# Patient Record
Sex: Female | Born: 1965 | Race: Black or African American | Hispanic: No | Marital: Single | Smoking: Never smoker
Health system: Southern US, Community
[De-identification: ages and names within clinical notes are randomized; demographics above are authoritative.]

## PROBLEM LIST (undated history)

## (undated) HISTORY — PX: CARPAL TUNNEL RELEASE: SHX101

## (undated) HISTORY — PX: OTHER SURGICAL HISTORY: SHX169

## (undated) HISTORY — PX: BREAST REDUCTION SURGERY: SHX8

---

## 2000-06-06 ENCOUNTER — Emergency Department (HOSPITAL_COMMUNITY): Admission: EM | Admit: 2000-06-06 | Discharge: 2000-06-06 | Payer: Self-pay | Admitting: Emergency Medicine

## 2000-09-03 ENCOUNTER — Emergency Department (HOSPITAL_COMMUNITY): Admission: EM | Admit: 2000-09-03 | Discharge: 2000-09-03 | Payer: Self-pay | Admitting: Emergency Medicine

## 2000-12-31 ENCOUNTER — Emergency Department (HOSPITAL_COMMUNITY): Admission: EM | Admit: 2000-12-31 | Discharge: 2000-12-31 | Payer: Self-pay | Admitting: Emergency Medicine

## 2001-02-13 ENCOUNTER — Emergency Department (HOSPITAL_COMMUNITY): Admission: EM | Admit: 2001-02-13 | Discharge: 2001-02-13 | Payer: Self-pay | Admitting: Emergency Medicine

## 2001-04-16 ENCOUNTER — Emergency Department (HOSPITAL_COMMUNITY): Admission: EM | Admit: 2001-04-16 | Discharge: 2001-04-16 | Payer: Self-pay | Admitting: Emergency Medicine

## 2001-04-16 ENCOUNTER — Encounter: Payer: Self-pay | Admitting: Emergency Medicine

## 2002-07-03 ENCOUNTER — Emergency Department (HOSPITAL_COMMUNITY): Admission: EM | Admit: 2002-07-03 | Discharge: 2002-07-03 | Payer: Self-pay | Admitting: *Deleted

## 2004-02-02 ENCOUNTER — Emergency Department (HOSPITAL_COMMUNITY): Admission: EM | Admit: 2004-02-02 | Discharge: 2004-02-02 | Payer: Self-pay | Admitting: Emergency Medicine

## 2004-06-01 ENCOUNTER — Emergency Department (HOSPITAL_COMMUNITY): Admission: EM | Admit: 2004-06-01 | Discharge: 2004-06-01 | Payer: Self-pay

## 2008-03-27 ENCOUNTER — Emergency Department (HOSPITAL_BASED_OUTPATIENT_CLINIC_OR_DEPARTMENT_OTHER): Admission: EM | Admit: 2008-03-27 | Discharge: 2008-03-27 | Payer: Self-pay | Admitting: Emergency Medicine

## 2009-05-18 ENCOUNTER — Ambulatory Visit (HOSPITAL_BASED_OUTPATIENT_CLINIC_OR_DEPARTMENT_OTHER): Admission: RE | Admit: 2009-05-18 | Discharge: 2009-05-18 | Payer: Self-pay | Admitting: Orthopaedic Surgery

## 2009-05-18 ENCOUNTER — Ambulatory Visit: Payer: Self-pay | Admitting: Diagnostic Radiology

## 2010-02-17 ENCOUNTER — Emergency Department (HOSPITAL_COMMUNITY): Admission: EM | Admit: 2010-02-17 | Discharge: 2010-02-18 | Payer: Self-pay | Admitting: Emergency Medicine

## 2011-09-09 ENCOUNTER — Emergency Department (HOSPITAL_BASED_OUTPATIENT_CLINIC_OR_DEPARTMENT_OTHER)
Admission: EM | Admit: 2011-09-09 | Discharge: 2011-09-09 | Disposition: A | Discharge status: Home / Self Care | Payer: Federal, State, Local not specified - PPO | Attending: Emergency Medicine | Admitting: Emergency Medicine

## 2011-09-09 ENCOUNTER — Encounter: Payer: Self-pay | Admitting: *Deleted

## 2011-09-09 DIAGNOSIS — A499 Bacterial infection, unspecified: Secondary | ICD-10-CM | POA: Insufficient documentation

## 2011-09-09 DIAGNOSIS — N760 Acute vaginitis: Secondary | ICD-10-CM | POA: Insufficient documentation

## 2011-09-09 DIAGNOSIS — R319 Hematuria, unspecified: Secondary | ICD-10-CM | POA: Insufficient documentation

## 2011-09-09 DIAGNOSIS — B9689 Other specified bacterial agents as the cause of diseases classified elsewhere: Secondary | ICD-10-CM | POA: Insufficient documentation

## 2011-09-09 LAB — URINALYSIS, ROUTINE W REFLEX MICROSCOPIC
Glucose, UA: NEGATIVE mg/dL
Leukocytes, UA: NEGATIVE
Nitrite: NEGATIVE
Protein, ur: NEGATIVE mg/dL
Urobilinogen, UA: 0.2 mg/dL (ref 0.0–1.0)

## 2011-09-09 LAB — URINE MICROSCOPIC-ADD ON

## 2011-09-09 LAB — PREGNANCY, URINE: Preg Test, Ur: NEGATIVE

## 2011-09-09 LAB — WET PREP, GENITAL: Trich, Wet Prep: NONE SEEN

## 2011-09-09 MED ORDER — METRONIDAZOLE 500 MG PO TABS: 500.0000 mg | ORAL_TABLET | Freq: Two times a day (BID) | ORAL | Status: AC

## 2011-09-09 NOTE — ED Provider Notes (Addendum)
History     CSN: [id number] Arrival date & time: 09/09/2011  4:52 AM   First MD Initiated Contact with Patient 09/09/11 [phone]      Chief Complaint  Patient presents with  . Vaginal Bleeding   a 45 year old female that is, gravida 3, para 3. Known history of tubal ligation. She states she has noticed a small amount of blood after urination when she wipes. She did not know whether the blood was in the urine./Urethra or vaginal. She does have regular menstrual cycles and is due for her next period. In 6 days. Patient denies any chance of being pregnant. She's had no rectal bleeding, no back pain, no nausea, vomiting, or weakness. She denies any known history of fibroids. She denies any recent sexual activity for at least 3 months. (Consider location/radiation/quality/duration/timing/severity/associated sxs/prior treatment) HPI  History reviewed. No pertinent past medical history.  History reviewed. No pertinent past surgical history.  No family history on file.  History  Substance Use Topics  . Smoking status: Not on file  . Smokeless tobacco: Not on file  . Alcohol Use: Not on file    OB History    Grav Para Term Preterm Abortions TAB SAB Ect Mult Living                  Review of Systems  All other systems reviewed and are negative.    Allergies  Percocet  Home Medications  No current outpatient prescriptions on file.  BP 129/78  Pulse 96  Temp(Src) 98.5 F (36.9 C) (Oral)  Resp 18  SpO2 100%  LMP 08/15/2011  Physical Exam  Constitutional: She is oriented to person, place, and time. She appears well-developed and well-nourished.  HENT:  Head: Normocephalic and atraumatic.  Eyes: Conjunctivae and EOM are normal. Pupils are equal, round, and reactive to light.  Neck: Neck supple.  Cardiovascular: Normal rate and regular rhythm.  Exam reveals no gallop and no friction rub.   No murmur heard. Pulmonary/Chest: Breath sounds normal. She has no wheezes. She has no  rales. She exhibits no tenderness.  Abdominal: Soft. Bowel sounds are normal. She exhibits no distension. There is no tenderness. There is no rebound and no guarding.  Genitourinary: Vagina normal.       Chaperone present during exam. External genitalia is normal. Cervix is closed, no bleeding, no discharge. Essentially normal  Musculoskeletal: Normal range of motion.  Neurological: She is alert and oriented to person, place, and time. No cranial nerve deficit. Coordination normal.  Skin: Skin is warm and dry. No rash noted.  Psychiatric: She has a normal mood and affect.    ED Course  Procedures (including critical care time)  Labs Reviewed  URINALYSIS, ROUTINE W REFLEX MICROSCOPIC - Abnormal; Notable for the following:    Appearance HAZY (*)    Specific Gravity, Urine 1.031 (*)    Hgb urine dipstick LARGE (*)    All other components within normal limits  WET PREP, GENITAL - Abnormal; Notable for the following:    Clue Cells, Wet Prep FEW (*)    WBC, Wet Prep HPF POC FEW (*)    All other components within normal limits  URINE MICROSCOPIC-ADD ON - Abnormal; Notable for the following:    Squamous Epithelial / LPF FEW (*)    Bacteria, UA FEW (*)    All other components within normal limits  PREGNANCY, URINE  GC/CHLAMYDIA PROBE AMP, GENITAL   No results found.   No diagnosis found.  MDM  Pt is seen and examined;  Initial history and physical completed.  Will follow.          Roshan Roback A. Patrica Duel, MD 09/09/11 0543  Results for orders placed during the hospital encounter of 09/09/11  PREGNANCY, URINE      Component Value Range   Preg Test, Ur NEGATIVE    URINALYSIS, ROUTINE W REFLEX MICROSCOPIC      Component Value Range   Color, Urine YELLOW  YELLOW    Appearance HAZY (*) CLEAR    Specific Gravity, Urine 1.031 (*) 1.005 - 1.030    pH 5.5  5.0 - 8.0    Glucose, UA NEGATIVE  NEGATIVE (mg/dL)   Hgb urine dipstick LARGE (*) NEGATIVE    Bilirubin Urine NEGATIVE   NEGATIVE    Ketones, ur NEGATIVE  NEGATIVE (mg/dL)   Protein, ur NEGATIVE  NEGATIVE (mg/dL)   Urobilinogen, UA 0.2  0.0 - 1.0 (mg/dL)   Nitrite NEGATIVE  NEGATIVE    Leukocytes, UA NEGATIVE  NEGATIVE   WET PREP, GENITAL      Component Value Range   Yeast, Wet Prep NONE SEEN  NONE SEEN    Trich, Wet Prep NONE SEEN  NONE SEEN    Clue Cells, Wet Prep FEW (*) NONE SEEN    WBC, Wet Prep HPF POC FEW (*) NONE SEEN   URINE MICROSCOPIC-ADD ON      Component Value Range   Squamous Epithelial / LPF FEW (*) RARE    WBC, UA 0-2  <3 (WBC/hpf)   RBC / HPF 7-10  <3 (RBC/hpf)   Bacteria, UA FEW (*) RARE    Urine-Other MUCOUS PRESENT     No results found.    Nichoel Digiulio A. Patrica Duel, MD 09/09/11 0543  Results for orders placed during the hospital encounter of 09/09/11  PREGNANCY, URINE      Component Value Range   Preg Test, Ur NEGATIVE    URINALYSIS, ROUTINE W REFLEX MICROSCOPIC      Component Value Range   Color, Urine YELLOW  YELLOW    Appearance HAZY (*) CLEAR    Specific Gravity, Urine 1.031 (*) 1.005 - 1.030    pH 5.5  5.0 - 8.0    Glucose, UA NEGATIVE  NEGATIVE (mg/dL)   Hgb urine dipstick LARGE (*) NEGATIVE    Bilirubin Urine NEGATIVE  NEGATIVE    Ketones, ur NEGATIVE  NEGATIVE (mg/dL)   Protein, ur NEGATIVE  NEGATIVE (mg/dL)   Urobilinogen, UA 0.2  0.0 - 1.0 (mg/dL)   Nitrite NEGATIVE  NEGATIVE    Leukocytes, UA NEGATIVE  NEGATIVE   WET PREP, GENITAL      Component Value Range   Yeast, Wet Prep NONE SEEN  NONE SEEN    Trich, Wet Prep NONE SEEN  NONE SEEN    Clue Cells, Wet Prep FEW (*) NONE SEEN    WBC, Wet Prep HPF POC FEW (*) NONE SEEN   URINE MICROSCOPIC-ADD ON      Component Value Range   Squamous Epithelial / LPF FEW (*) RARE    WBC, UA 0-2  <3 (WBC/hpf)   RBC / HPF 7-10  <3 (RBC/hpf)   Bacteria, UA FEW (*) RARE    Urine-Other MUCOUS PRESENT     No results found.    Erna Brossard A. Patrica Duel, MD 09/09/11 [id number]  Lorelle Gibbs. Patrica Duel, MD 09/09/11 [phone]

## 2011-09-09 NOTE — ED Notes (Signed)
Patient states that when she urinates there is a small amount of blood present. Unsure if this is vaginal or urethral bleeding. Still gets regular periods and this is not time, also does not use birth control. Denies any other symptoms

## 2011-09-10 LAB — GC/CHLAMYDIA PROBE AMP, GENITAL: GC Probe Amp, Genital: NEGATIVE

## 2011-09-18 IMAGING — CR DG TIBIA/FIBULA 2V*L*
4 series · 4 of 4 positions shown · non-contrast
Comparison: None

CLINICAL DATA: Injured leg.

LEFT TIBIA AND FIBULA - 2 VIEW

[t tib/fib ap right (1 of 2)]
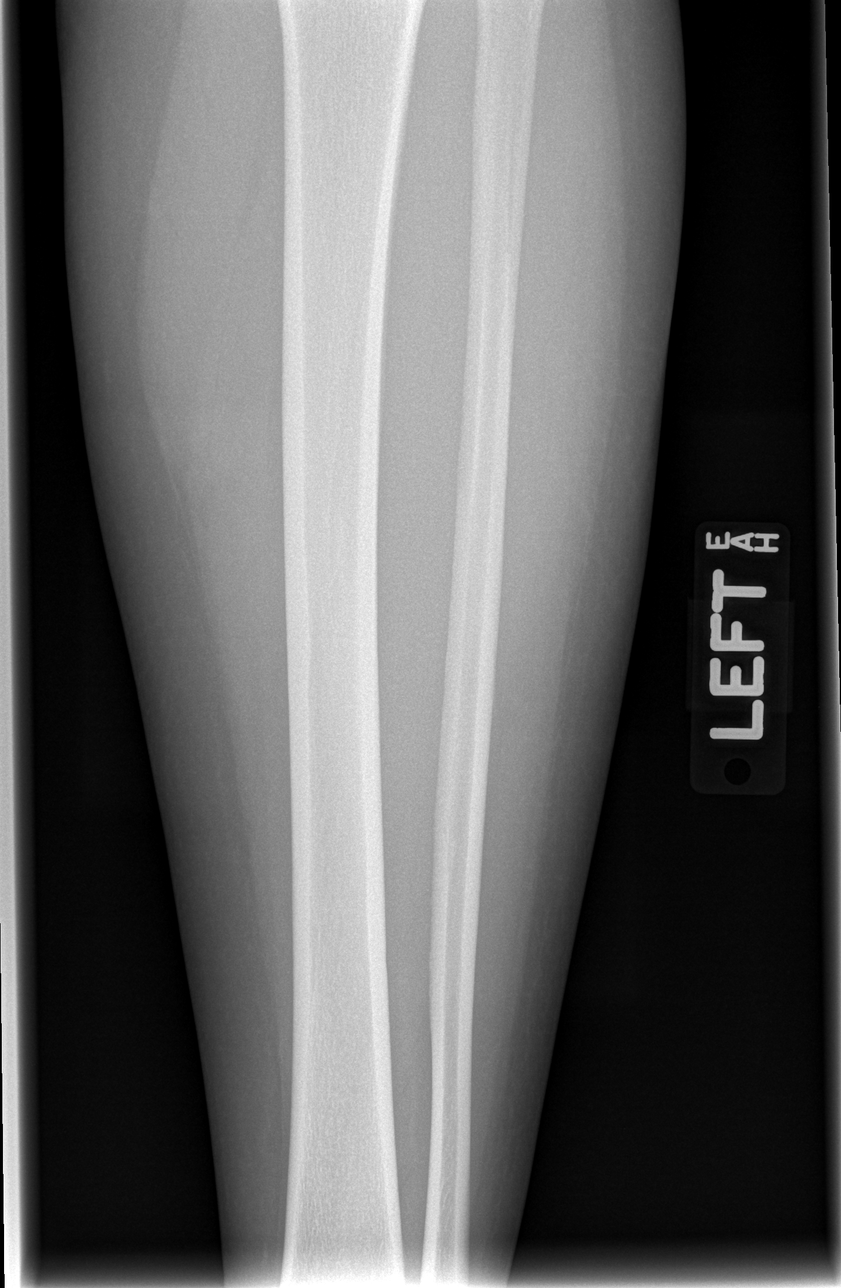

[t tib/fib ap right (2 of 2)]
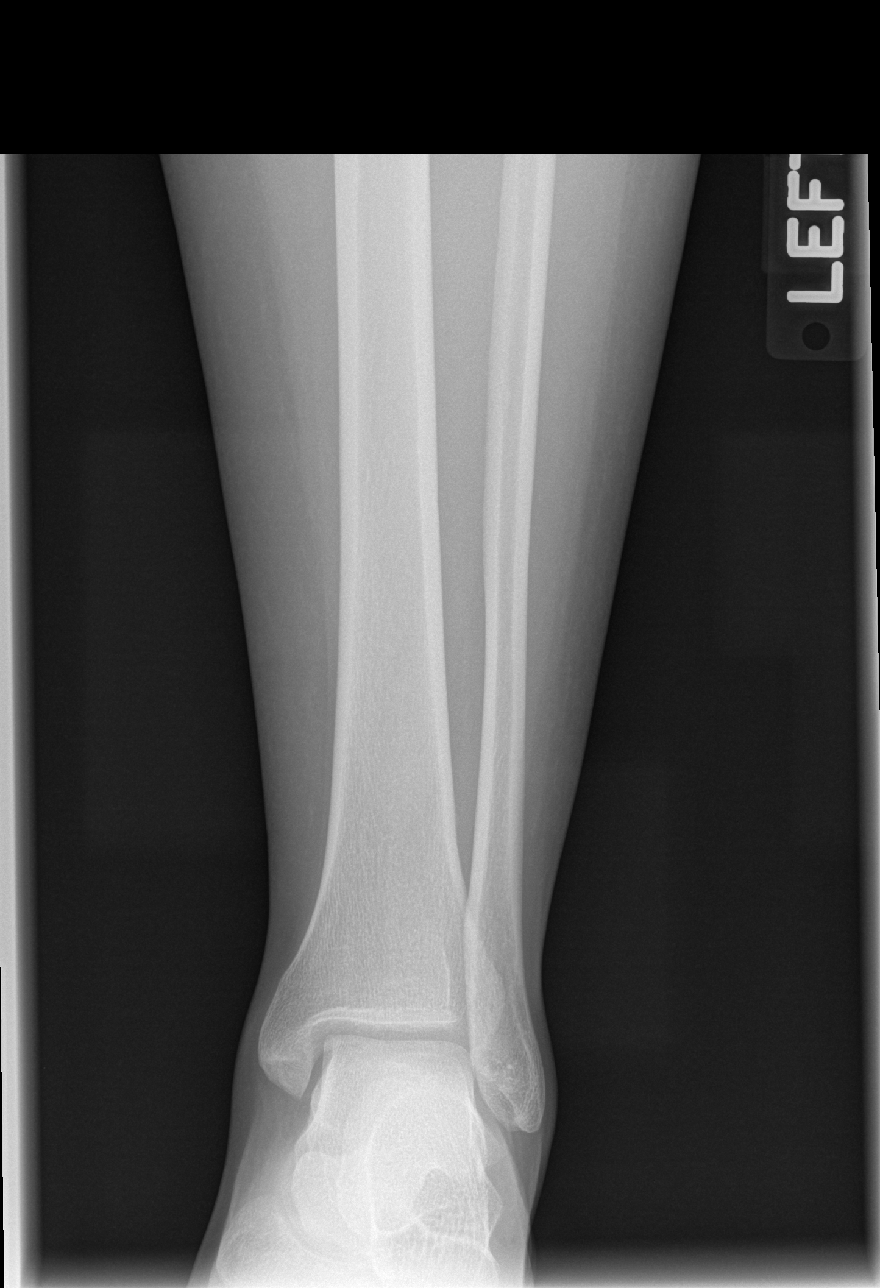

[t tib/fib lat right (1 of 2)]
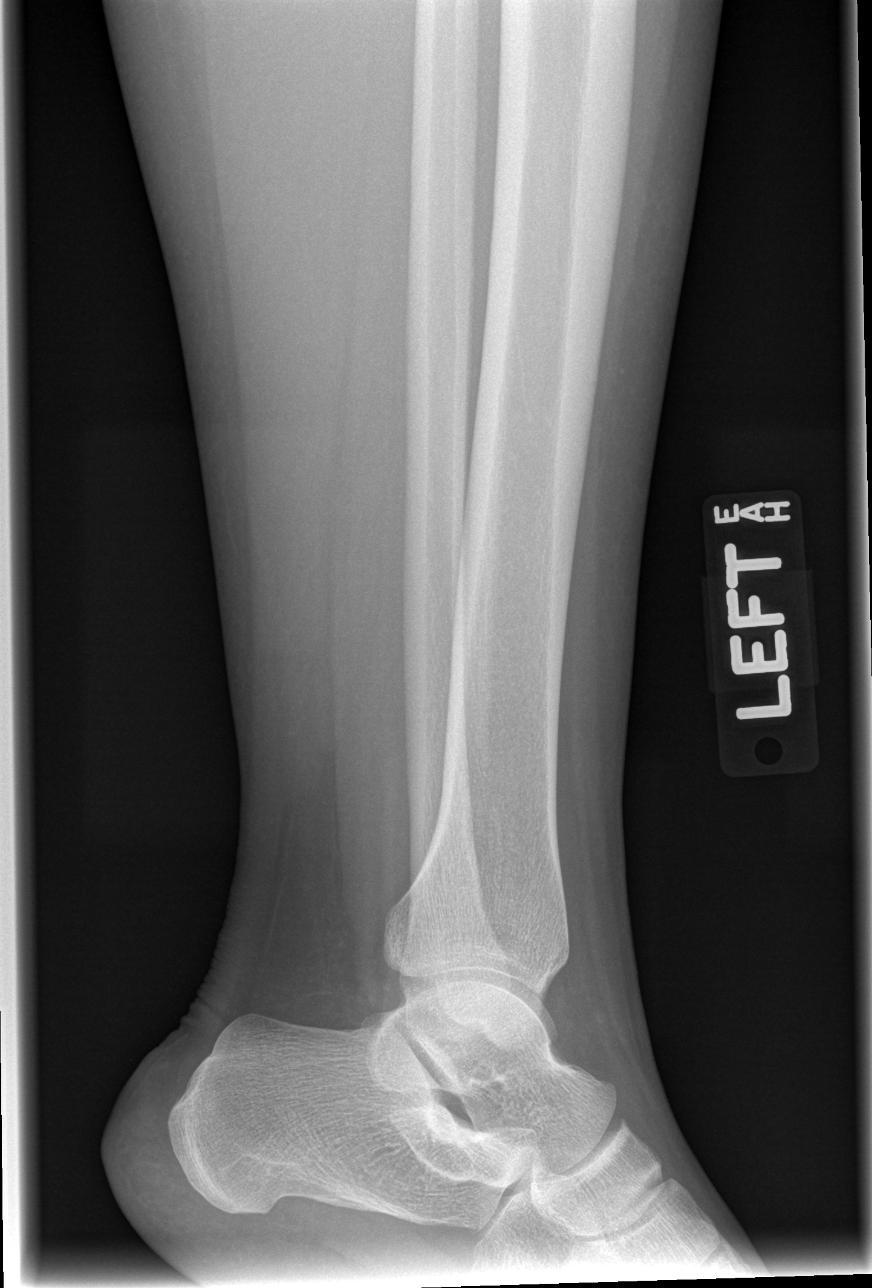

[t tib/fib lat right (2 of 2)]
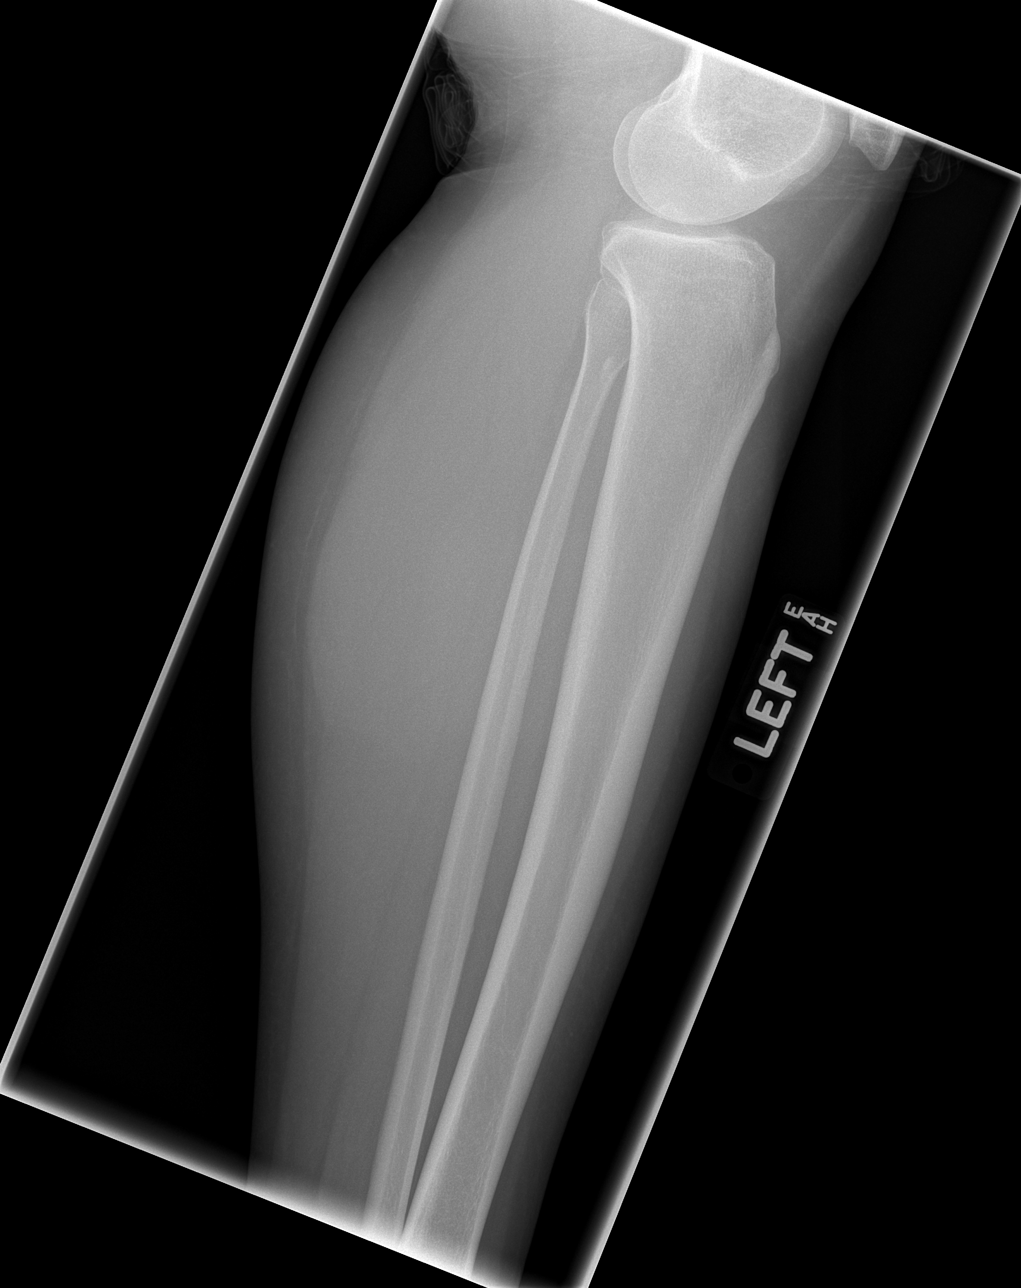

[4 of 4 positions shown; findings below may reference images not displayed]

FINDINGS: The knee and ankle joints are maintained.  No tibial or
fibular fractures are seen.
IMPRESSION: No acute bony findings.

## 2014-02-07 ENCOUNTER — Emergency Department (HOSPITAL_BASED_OUTPATIENT_CLINIC_OR_DEPARTMENT_OTHER)
Admission: EM | Admit: 2014-02-07 | Discharge: 2014-02-07 | Disposition: A | Discharge status: Home / Self Care | Payer: Federal, State, Local not specified - PPO | Attending: Emergency Medicine | Admitting: Emergency Medicine

## 2014-02-07 ENCOUNTER — Encounter (HOSPITAL_BASED_OUTPATIENT_CLINIC_OR_DEPARTMENT_OTHER): Payer: Self-pay | Admitting: Emergency Medicine

## 2014-02-07 DIAGNOSIS — L02219 Cutaneous abscess of trunk, unspecified: Secondary | ICD-10-CM | POA: Insufficient documentation

## 2014-02-07 DIAGNOSIS — L03319 Cellulitis of trunk, unspecified: Principal | ICD-10-CM

## 2014-02-07 DIAGNOSIS — L0390 Cellulitis, unspecified: Secondary | ICD-10-CM

## 2014-02-07 MED ORDER — CEPHALEXIN 500 MG PO CAPS
500.0000 mg | ORAL_CAPSULE | Freq: Four times a day (QID) | ORAL | Status: AC
Start: 2014-02-07 — End: ?

## 2014-02-07 MED ORDER — IBUPROFEN 800 MG PO TABS
800.0000 mg | ORAL_TABLET | Freq: Once | ORAL | Status: AC
  Administered 2014-02-07: 800 mg via ORAL
  Filled 2014-02-07: qty 1

## 2014-02-07 MED ORDER — IBUPROFEN 800 MG PO TABS: 800.0000 mg | ORAL_TABLET | Freq: Three times a day (TID) | ORAL | Status: AC

## 2014-02-07 MED ORDER — CEPHALEXIN 250 MG PO CAPS
500.0000 mg | ORAL_CAPSULE | Freq: Once | ORAL | Status: AC
  Administered 2014-02-07: 500 mg via ORAL
  Filled 2014-02-07: qty 2

## 2014-02-07 NOTE — ED Notes (Signed)
Pt c/o abscess to left side - states area present x3 days, area reddened, swollen, and painful.

## 2014-02-07 NOTE — ED Provider Notes (Signed)
CSN: [id number]     Arrival date & time 02/07/14  0347 History   First MD Initiated Contact with Patient 02/07/14 0541     Chief Complaint  Patient presents with  . Abscess     (Consider location/radiation/quality/duration/timing/severity/associated sxs/prior Treatment) Patient is a 48 y.o. female presenting with rash. The history is provided by the patient.  Rash Location: isolated spot left lateral back. Quality: painful and redness   Quality comment:  Under bra strap Pain details:    Quality:  Aching   Severity:  Moderate   Onset quality:  Sudden   Timing:  Constant   Progression:  Unchanged Severity:  Moderate Onset quality:  Sudden Timing:  Constant Progression:  Unchanged Chronicity:  New Context: insect bite/sting   Relieved by:  Nothing Worsened by:  Nothing tried Ineffective treatments:  None tried Associated symptoms: no fever     History reviewed. No pertinent past medical history. Past Surgical History  Procedure Laterality Date  . Lump removed      right breast  . Forehead surgery    . Carpal tunnel release    . Breast reduction surgery     History reviewed. No pertinent family history. History  Substance Use Topics  . Smoking status: Never Smoker   . Smokeless tobacco: Not on file  . Alcohol Use: No   OB History   Grav Para Term Preterm Abortions TAB SAB Ect Mult Living                 Review of Systems  Constitutional: Negative for fever.  Skin: Positive for rash.  All other systems reviewed and are negative.     Allergies  Percocet  Home Medications   Prior to Admission medications   Not on File   BP 135/62  Pulse 67  Temp(Src) 99.2 F (37.3 C) (Oral)  Resp 21  Ht 5\' 7"  (1.702 m)  Wt 179 lb (81.194 kg)  BMI 28.03 kg/m2  SpO2 100%  LMP 01/16/2014 Physical Exam  Constitutional: She is oriented to person, place, and time. She appears well-developed and well-nourished. No distress.  HENT:  Head: Normocephalic and  atraumatic.  Eyes: Conjunctivae and EOM are normal.  Neck: Normal range of motion.  Cardiovascular: Normal rate and regular rhythm.   Pulmonary/Chest: Effort normal and breath sounds normal. She has no wheezes. She has no rales.    Abdominal: Soft. Bowel sounds are normal. There is no tenderness. There is no rebound and no guarding.  Musculoskeletal: Normal range of motion.  Neurological: She is alert and oriented to person, place, and time.  Skin: Skin is warm and dry.  Psychiatric: She has a normal mood and affect.    ED Course  Procedures (including critical care time) Labs Review Labs Reviewed - No data to display  Imaging Review No results found.   EKG Interpretation None      MDM   Final diagnoses:  None    Will treat for cellulitis suspect bra strap grating against the area is what caused the inflammation.  Will treat with nsaids keflex and warm compresses    Tenya Araque K Sharlotte Baka-Rasch, MD 02/07/14 [phone]

## 2014-02-07 NOTE — Discharge Instructions (Signed)
Cellulitis °Cellulitis is an infection of the skin and the tissue under the skin. The infected area is usually red and tender. This happens most often in the arms and lower legs. °HOME CARE  °· Take your antibiotic medicine as told. Finish the medicine even if you start to feel better. °· Keep the infected arm or leg raised (elevated). °· Put a warm cloth on the area up to 4 times per day. °· Only take medicines as told by your doctor. °· Keep all doctor visits as told. °GET HELP RIGHT AWAY IF:  °· You have a fever. °· You feel very sleepy. °· You throw up (vomit) or have watery poop (diarrhea). °· You feel sick and have muscle aches and pains. °· You see red streaks on the skin coming from the infected area. °· Your red area gets bigger or turns a dark color. °· Your bone or joint under the infected area is painful after the skin heals. °· Your infection comes back in the same area or different area. °· You have a puffy (swollen) bump in the infected area. °· You have new symptoms. °MAKE SURE YOU:  °· Understand these instructions. °· Will watch your condition. °· Will get help right away if you are not doing well or get worse. °Document Released: 03/26/2008 Document Revised: 04/08/2012 Document Reviewed: 12/24/2011 °[employer]® Patient Information ©2014 [employer]. ° °

## 2016-05-06 ENCOUNTER — Emergency Department (HOSPITAL_BASED_OUTPATIENT_CLINIC_OR_DEPARTMENT_OTHER): Payer: Federal, State, Local not specified - PPO

## 2016-05-06 ENCOUNTER — Encounter (HOSPITAL_BASED_OUTPATIENT_CLINIC_OR_DEPARTMENT_OTHER): Payer: Self-pay | Admitting: *Deleted

## 2016-05-06 ENCOUNTER — Emergency Department (HOSPITAL_BASED_OUTPATIENT_CLINIC_OR_DEPARTMENT_OTHER)
Admission: EM | Admit: 2016-05-06 | Discharge: 2016-05-06 | Disposition: A | Discharge status: Home / Self Care | Payer: Federal, State, Local not specified - PPO | Attending: Emergency Medicine | Admitting: Emergency Medicine

## 2016-05-06 DIAGNOSIS — M62838 Other muscle spasm: Secondary | ICD-10-CM | POA: Insufficient documentation

## 2016-05-06 DIAGNOSIS — M542 Cervicalgia: Secondary | ICD-10-CM | POA: Diagnosis present

## 2016-05-06 LAB — PREGNANCY, URINE: Preg Test, Ur: NEGATIVE

## 2016-05-06 MED ORDER — METHOCARBAMOL 500 MG PO TABS: 500.0000 mg | ORAL_TABLET | Freq: Two times a day (BID) | ORAL | Status: AC

## 2016-05-06 MED ORDER — DEXAMETHASONE SODIUM PHOSPHATE 10 MG/ML IJ SOLN
10.0000 mg | Freq: Once | INTRAMUSCULAR | Status: AC
  Administered 2016-05-06: 10 mg via INTRAMUSCULAR
  Filled 2016-05-06: qty 1

## 2016-05-06 MED ORDER — GI COCKTAIL ~~LOC~~
30.0000 mL | Freq: Once | ORAL | Status: AC
  Administered 2016-05-06: 30 mL via ORAL
  Filled 2016-05-06: qty 30

## 2016-05-06 MED ORDER — METHOCARBAMOL 500 MG PO TABS
1000.0000 mg | ORAL_TABLET | Freq: Once | ORAL | Status: AC
  Administered 2016-05-06: 1000 mg via ORAL
  Filled 2016-05-06: qty 2

## 2016-05-06 MED ORDER — KETOROLAC TROMETHAMINE 60 MG/2ML IM SOLN
60.0000 mg | Freq: Once | INTRAMUSCULAR | Status: AC
  Administered 2016-05-06: 60 mg via INTRAMUSCULAR
  Filled 2016-05-06: qty 2

## 2016-05-06 MED ORDER — DICLOFENAC SODIUM ER 100 MG PO TB24: 100.0000 mg | ORAL_TABLET | Freq: Every day | ORAL | Status: AC

## 2016-05-06 NOTE — Discharge Instructions (Signed)
Heat Therapy  Heat therapy can help ease sore, stiff, injured, and tight muscles and joints. Heat relaxes your muscles, which may help ease your pain.   RISKS AND COMPLICATIONS  If you have any of the following conditions, do not use heat therapy unless your health care provider has approved:   Poor circulation.   Healing wounds or scarred skin in the area being treated.   Diabetes, heart disease, or high blood pressure.   Not being able to feel (numbness) the area being treated.   Unusual swelling of the area being treated.   Active infections.   Blood clots.   Cancer.   Inability to communicate pain. This may include young children and people who have problems with their brain function (dementia).   Pregnancy.  Heat therapy should only be used on old, pre-existing, or long-lasting (chronic) injuries. Do not use heat therapy on new injuries unless directed by your health care provider.  HOW TO USE HEAT THERAPY  There are several different kinds of heat therapy, including:   Moist heat pack.   Warm water bath.   Hot water bottle.   Electric heating pad.   Heated gel pack.   Heated wrap.   Electric heating pad.  Use the heat therapy method suggested by your health care provider. Follow your health care provider's instructions on when and how to use heat therapy.  GENERAL HEAT THERAPY RECOMMENDATIONS   Do not sleep while using heat therapy. Only use heat therapy while you are awake.   Your skin may turn pink while using heat therapy. Do not use heat therapy if your skin turns red.   Do not use heat therapy if you have new pain.   High heat or long exposure to heat can cause burns. Be careful when using heat therapy to avoid burning your skin.   Do not use heat therapy on areas of your skin that are already irritated, such as with a rash or sunburn.  SEEK MEDICAL CARE IF:   You have blisters, redness, swelling, or numbness.   You have new pain.   Your pain is worse.  MAKE SURE  YOU:   Understand these instructions.   Will watch your condition.   Will get help right away if you are not doing well or get worse.     This information is not intended to replace advice given to you by your health care provider. Make sure you discuss any questions you have with your health care provider.     Document Released: 12/31/2011 Document Revised: 10/29/2014 Document Reviewed: 12/01/2013  Elsevier Interactive Patient Education 2016 [employer]

## 2016-05-06 NOTE — ED Notes (Signed)
Patient transported to X-ray 

## 2016-05-06 NOTE — ED Provider Notes (Signed)
CSN: [id number]     Arrival date & time 05/06/16  [id number] History   First MD Initiated Contact with Patient 05/06/16 [phone]     Chief Complaint  Patient presents with  . Back Pain  . Neck Pain     (Consider location/radiation/quality/duration/timing/severity/associated sxs/prior Treatment) Patient is a 50 y.o. female presenting with neck pain. The history is provided by the patient.  Neck Pain Pain location:  R side Quality:  Aching Pain radiates to:  R shoulder Pain severity:  Moderate Pain is:  Same all the time Onset quality:  Gradual Timing:  Constant Progression:  Worsening Chronicity:  New Context: not MVA   Context comment:  Repetitive moving of 10 lbs Relieved by:  Nothing Worsened by:  Nothing tried Ineffective treatments:  None tried Associated symptoms: no bladder incontinence, no bowel incontinence, no chest pain, no fever, no headaches, no leg pain, no numbness, no paresis, no photophobia, no syncope, no tingling, no visual change, no weakness and no weight loss   Risk factors: no hx of head and neck radiation     History reviewed. No pertinent past medical history. Past Surgical History  Procedure Laterality Date  . Lump removed      right breast  . Forehead surgery    . Carpal tunnel release    . Breast reduction surgery     History reviewed. No pertinent family history. Social History  Substance Use Topics  . Smoking status: Never Smoker   . Smokeless tobacco: None  . Alcohol Use: Yes   OB History    No data available     Review of Systems  Constitutional: Negative for fever and weight loss.  Eyes: Negative for photophobia.  Respiratory: Negative for cough, chest tightness and shortness of breath.   Cardiovascular: Negative for chest pain, palpitations, leg swelling and syncope.  Gastrointestinal: Negative for bowel incontinence.  Genitourinary: Negative for bladder incontinence and difficulty urinating.  Musculoskeletal: Positive for neck pain.   Neurological: Negative for tingling, weakness, numbness and headaches.  All other systems reviewed and are negative.     Allergies  Percocet  Home Medications   Prior to Admission medications   Medication Sig Start Date End Date Taking? Authorizing Provider  cephALEXin (KEFLEX) 500 MG capsule Take 1 capsule (500 mg total) by mouth 4 (four) times daily. 02/07/14   Virgina Deakins, MD  ibuprofen (ADVIL,MOTRIN) 800 MG tablet Take 1 tablet (800 mg total) by mouth 3 (three) times daily. 02/07/14   Zailee Vallely, MD   BP 112/79 mmHg  Pulse 102  Temp(Src) 98.8 F (37.1 C) (Oral)  Resp 18  Ht 5' 6.5" (1.689 m)  Wt 188 lb (85.276 kg)  BMI 29.89 kg/m2  SpO2 100%  LMP 04/29/2016 Physical Exam  Constitutional: She is oriented to person, place, and time. She appears well-developed and well-nourished. No distress.  HENT:  Head: Normocephalic and atraumatic.  Mouth/Throat: Oropharynx is clear and moist.  Eyes: Conjunctivae are normal. Pupils are equal, round, and reactive to light.  Neck: Normal range of motion. Neck supple. No JVD present. No tracheal deviation present.  Cardiovascular: Normal rate, regular rhythm and intact distal pulses.   Pulmonary/Chest: Effort normal and breath sounds normal. No stridor. No respiratory distress. She has no wheezes. She has no rales. She exhibits no tenderness.  Abdominal: Soft. Bowel sounds are normal. There is no tenderness. There is no rebound and no guarding.  Musculoskeletal: Normal range of motion. She exhibits no edema or tenderness.  Right shoulder: Normal.       Right elbow: Normal.      Right wrist: Normal.       Cervical back: Normal.       Thoracic back: Normal.       Lumbar back: Normal.       Right hand: Normal. She exhibits normal capillary refill. Normal sensation noted. Normal strength noted.       Left hand: Normal. She exhibits normal capillary refill. Normal sensation noted. Normal strength noted.  spasm in the right  trapezius  Lymphadenopathy:    She has no cervical adenopathy.  Neurological: She is alert and oriented to person, place, and time. She has normal reflexes.  Skin: Skin is warm and dry.  Psychiatric: She has a normal mood and affect.    ED Course  Procedures (including critical care time) Labs Review Labs Reviewed  PREGNANCY, URINE    Imaging Review No results found. I have personally reviewed and evaluated these images and lab results as part of my medical decision-making.   EKG Interpretation None      MDM   Final diagnoses:  None   Filed Vitals:   05/06/16 0403  BP: 112/79  Pulse: 102  Temp: 98.8 F (37.1 C)  Resp: 18   Medications  gi cocktail (Maalox,Lidocaine,Donnatal) (30 mLs Oral Given 05/06/16 0424)  dexamethasone (DECADRON) injection 10 mg (10 mg Intramuscular Given 05/06/16 0424)  ketorolac (TORADOL) injection 60 mg (60 mg Intramuscular Given 05/06/16 0424)  methocarbamol (ROBAXIN) tablet 1,000 mg (1,000 mg Oral Given 05/06/16 0423)    Results for orders placed or performed during the hospital encounter of 05/06/16  Pregnancy, urine  Result Value Ref Range   Preg Test, Ur NEGATIVE NEGATIVE   Dg Chest 2 View  05/06/2016  CLINICAL DATA:  Neck pain radiating to the right shoulder, right upper chest, and back. EXAM: CHEST  2 VIEW COMPARISON:  None. FINDINGS: The heart size and mediastinal contours are within normal limits. Both lungs are clear. The visualized skeletal structures are unremarkable. IMPRESSION: No active cardiopulmonary disease. Electronically Signed   By: Burman Nieves M.D.   On: 05/06/2016 05:12   Dg Cervical Spine Complete  05/06/2016  CLINICAL DATA:  Pain and spasm in the neck beginning yesterday. EXAM: CERVICAL SPINE - COMPLETE 4+ VIEW COMPARISON:  None. FINDINGS: Straightening of the usual cervical lordosis. This may be due to patient positioning but ligamentous injury or muscle spasm could also have this appearance and are not excluded.  No anterior subluxation. There is no evidence of cervical spine fracture or prevertebral soft tissue swelling. No other significant bone abnormalities are identified. IMPRESSION: Nonspecific straightening of usual cervical lordosis. No anterior subluxation. No acute displaced fractures identified. Electronically Signed   By: Burman Nieves M.D.   On: 05/06/2016 05:14     Spasm in the trapezius muscle.  Will treat with NSAIDs, Tylenol and muscle relaxants.  No heavy lifting.Based on history and exam patient has been appropriately medically screened and emergency conditions excluded. Patient is stable for discharge at this time. Follow up provided and strict return precautions given. Finger swelling is chronic and uncertain etiology. Follow up with hand surgery regarding this issue.    Cy Blamer, MD 05/06/16 [phone]

## 2016-05-06 NOTE — ED Notes (Signed)
MD at bedside. 

## 2016-05-06 NOTE — ED Notes (Signed)
Pt sts her pain began yesterday in her neck and tonight it began radiating down into her right shoulder,right arm, right upper chest, and right mid back and down to her right knee.

## 2017-12-04 IMAGING — CR DG CHEST 2V
2 series · 2 of 2 positions shown · non-contrast
Comparison: None.

CLINICAL DATA: Neck pain radiating to the right shoulder, right
upper chest, and back.

EXAM:
CHEST  2 VIEW

[w chest lat]
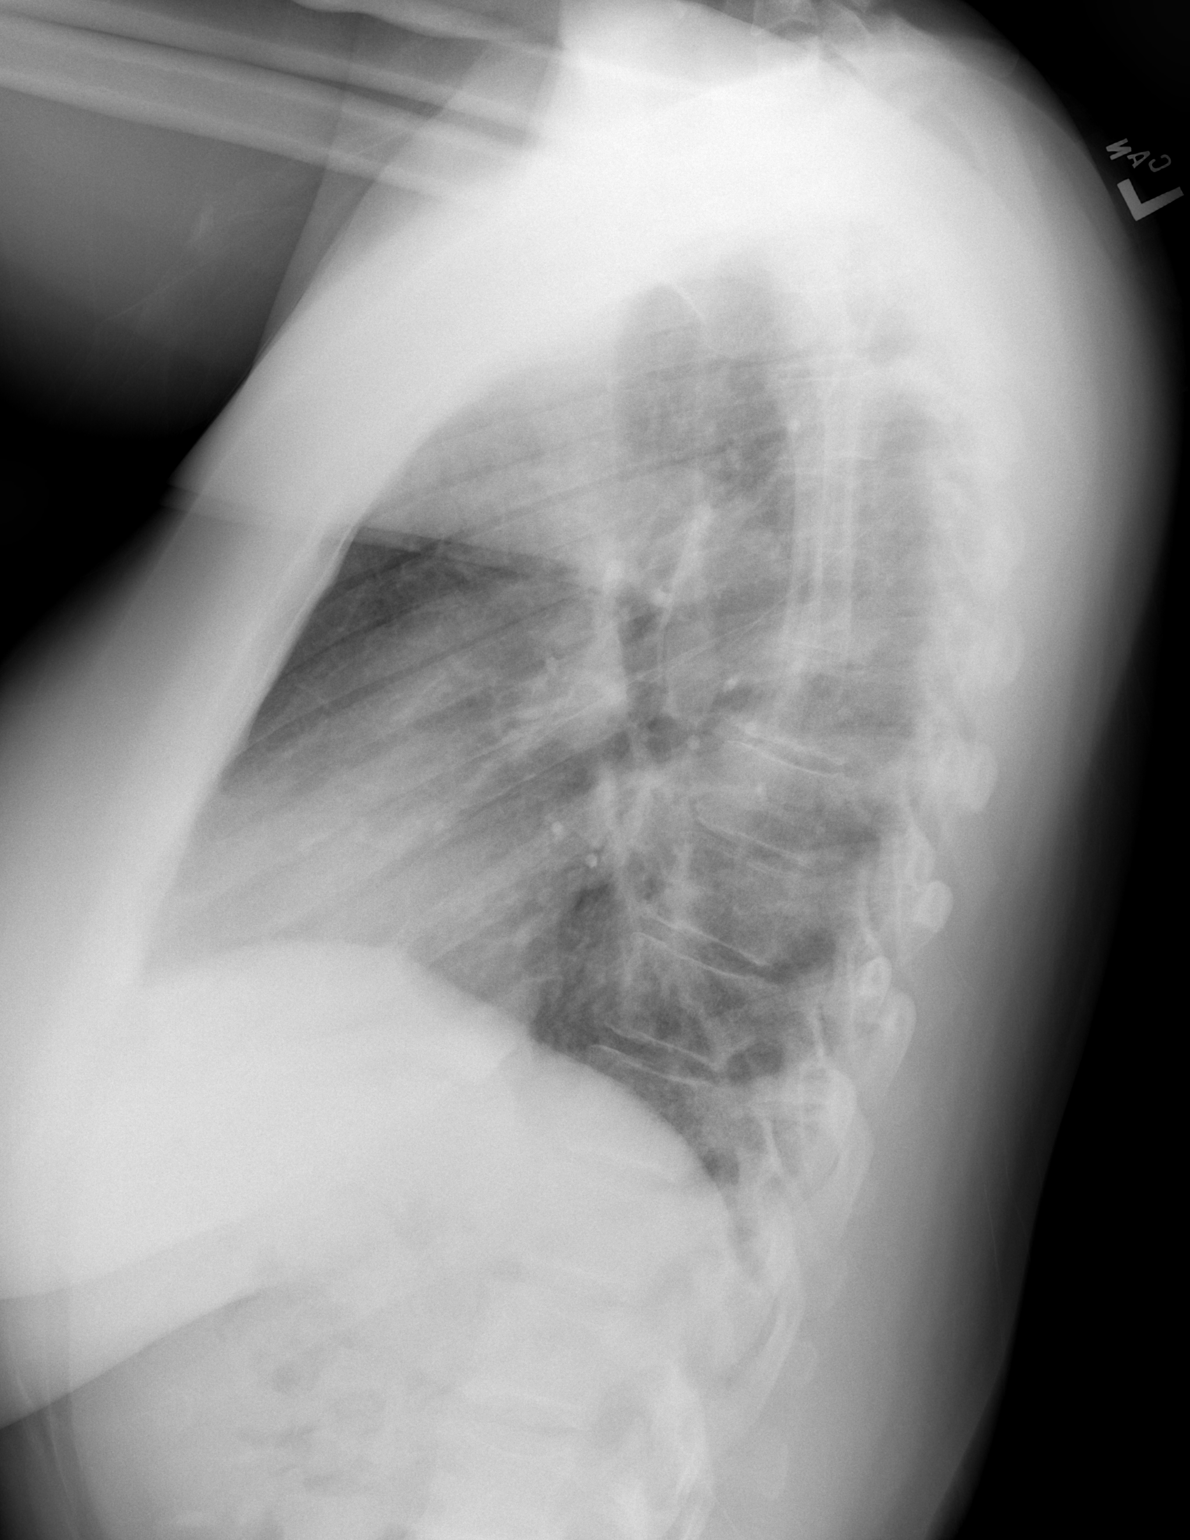

[w chest pa]
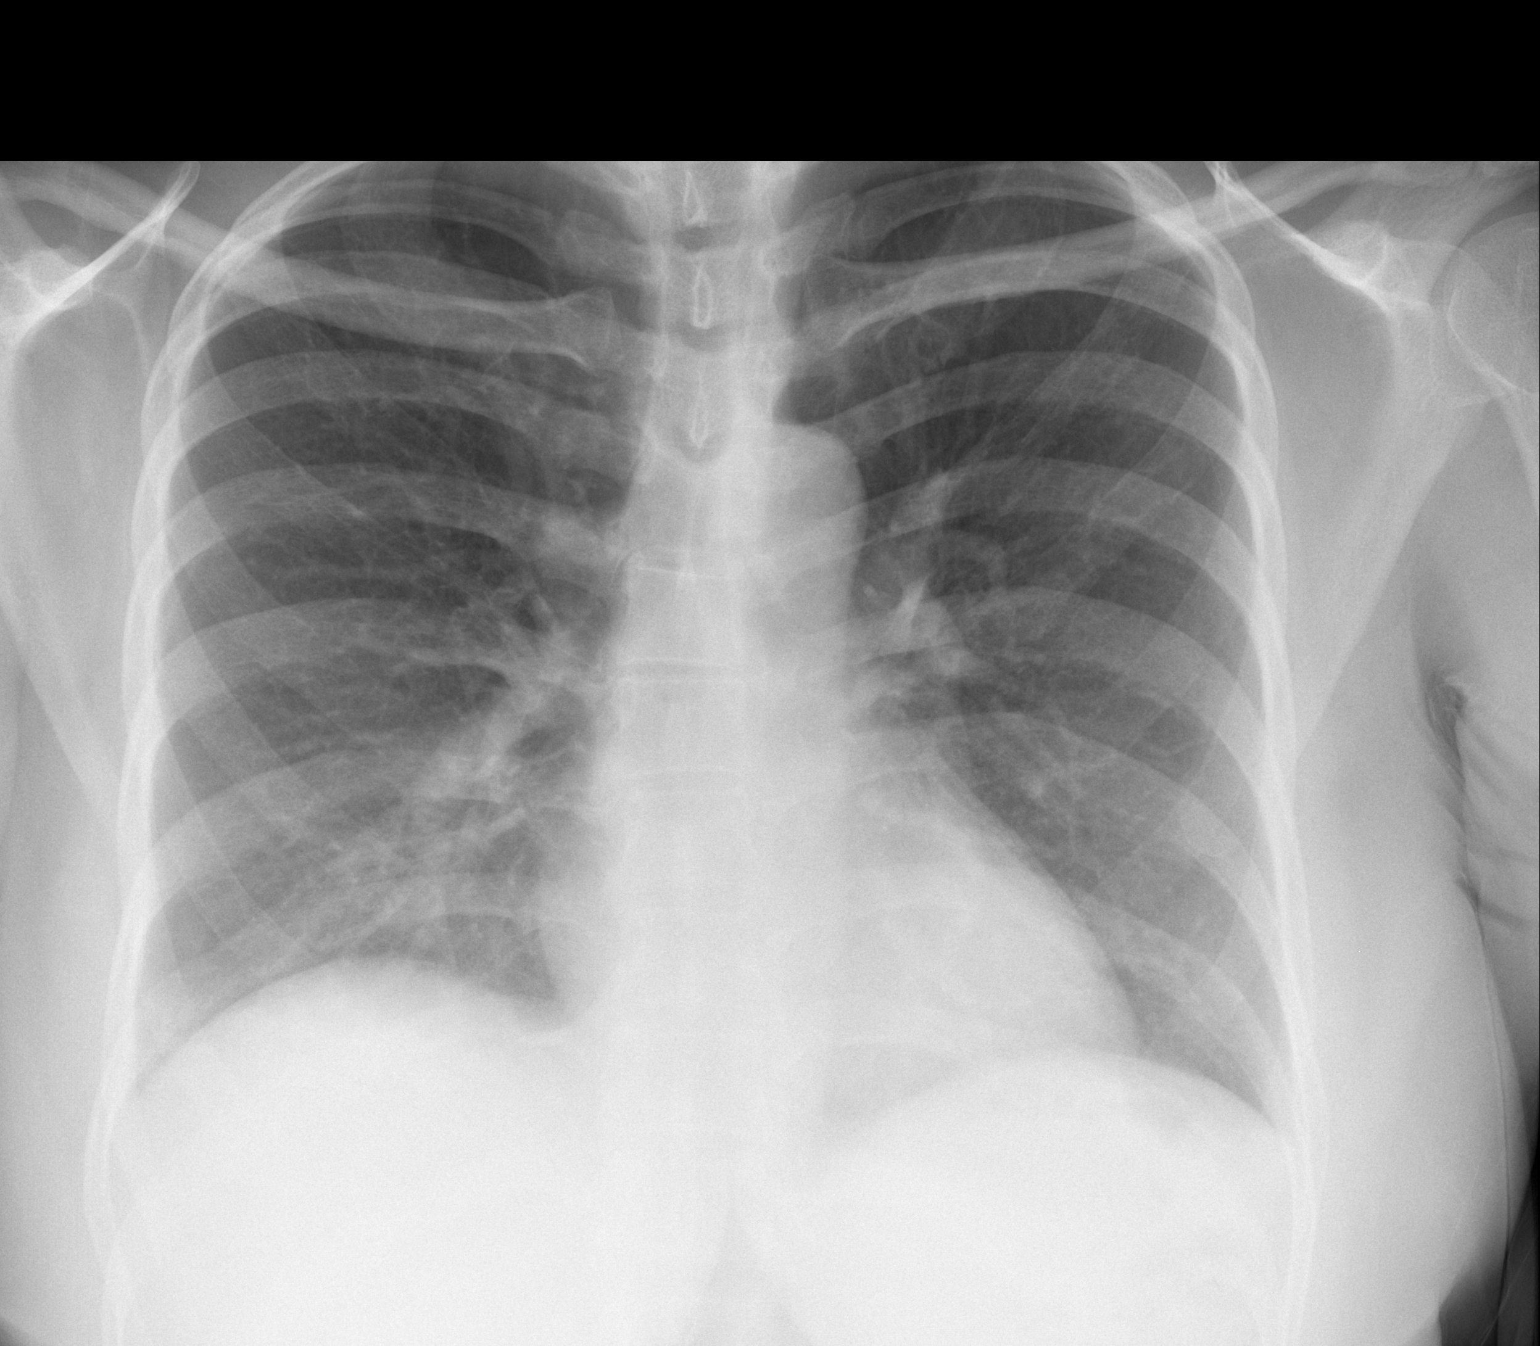

[2 of 2 positions shown; findings below may reference images not displayed]

FINDINGS: The heart size and mediastinal contours are within normal limits.
Both lungs are clear. The visualized skeletal structures are
unremarkable.
IMPRESSION: No active cardiopulmonary disease.
